# Patient Record
Sex: Male | Born: 1965 | Race: White | Hispanic: No | Marital: Married | Smoking: Current every day smoker
Health system: Southern US, Community
[De-identification: ages and names within clinical notes are randomized; demographics above are authoritative.]

## PROBLEM LIST (undated history)

## (undated) HISTORY — PX: CERVICAL DISC SURGERY: SHX588

---

## 2014-10-14 ENCOUNTER — Encounter (HOSPITAL_COMMUNITY): Payer: Self-pay | Admitting: Emergency Medicine

## 2014-10-14 ENCOUNTER — Emergency Department (HOSPITAL_COMMUNITY)
Admission: EM | Admit: 2014-10-14 | Discharge: 2014-10-14 | Disposition: A | Discharge status: Home / Self Care | Payer: Self-pay | Attending: Emergency Medicine | Admitting: Emergency Medicine

## 2014-10-14 DIAGNOSIS — L255 Unspecified contact dermatitis due to plants, except food: Secondary | ICD-10-CM

## 2014-10-14 DIAGNOSIS — Z720 Tobacco use: Secondary | ICD-10-CM | POA: Insufficient documentation

## 2014-10-14 DIAGNOSIS — L237 Allergic contact dermatitis due to plants, except food: Secondary | ICD-10-CM | POA: Insufficient documentation

## 2014-10-14 MED ORDER — FAMOTIDINE IN NACL 20-0.9 MG/50ML-% IV SOLN
20.0000 mg | Freq: Once | INTRAVENOUS | Status: AC
  Administered 2014-10-14: 20 mg via INTRAVENOUS
  Filled 2014-10-14: qty 50

## 2014-10-14 MED ORDER — PREDNISONE 20 MG PO TABS: 40.0000 mg | ORAL_TABLET | Freq: Every day | ORAL | Status: DC

## 2014-10-14 MED ORDER — SODIUM CHLORIDE 0.9 % IV BOLUS (SEPSIS)
500.0000 mL | Freq: Once | INTRAVENOUS | Status: AC
  Administered 2014-10-14: 500 mL via INTRAVENOUS

## 2014-10-14 MED ORDER — METHYLPREDNISOLONE SODIUM SUCC 125 MG IJ SOLR
125.0000 mg | Freq: Once | INTRAMUSCULAR | Status: AC
  Administered 2014-10-14: 125 mg via INTRAVENOUS
  Filled 2014-10-14: qty 2

## 2014-10-14 MED ORDER — DIPHENHYDRAMINE HCL 50 MG/ML IJ SOLN
25.0000 mg | Freq: Once | INTRAMUSCULAR | Status: AC
  Administered 2014-10-14: 25 mg via INTRAVENOUS
  Filled 2014-10-14: qty 1

## 2014-10-14 NOTE — ED Notes (Signed)
Pt reports he was working outside yesterday in poison [place]. Woke up during the night with facial edema and redness to his arms. Pt denies any difficulty breathing.

## 2014-10-14 NOTE — ED Notes (Signed)
Swelling to right eye has decreased, pt states that he feels better and feels that the swelling has decreased as well, Tammy PA at bedside,

## 2014-10-14 NOTE — ED Provider Notes (Signed)
CSN: [id number]     Arrival date & time 10/14/14  1029 History  This chart was scribed for Jason Gilbertammi Toben Acuna, PA-C with Donnetta HutchingBrian Cook, MD by Tonye RoyaltyJoshua Chen, ED Scribe. This patient was seen in room APA10/APA10 and the patient's care was started at 11:11 AM.    Chief Complaint  Patient presents with  . Allergic Reaction   The history is provided by the patient. No language interpreter was used.    HPI Comments: Jason GooRobert W Ho is a 49 y.o. male who presents to the Emergency Department complaining of swelling and redness to face and arms upon waking today. He also reports itchiness but denies any pain. He states he was doing yard work outside yesterday and came into contact with poison [place]ac, though he was wearing long sleeves. He denies anything affecting to his chest, back, or legs. He has not used Benadryl, cold compress, or anything else for his symptoms. He states he has had poison [place]ac before but states it has not been this bad previously. He does not use HTN medication, ASA, or Ibuprofen. He denies new lotions, soaps, or detergents. He denies difficulty swallowing, fever or SOB.  History reviewed. No pertinent past medical history. Past Surgical History  Procedure Laterality Date  . Cervical disc surgery     History reviewed. No pertinent family history. History  Substance Use Topics  . Smoking status: Current Every Day Smoker -- 0.50 packs/day    Types: Cigarettes  . Smokeless tobacco: Never Used  . Alcohol Use: No    Review of Systems  Constitutional: Negative for fever, activity change and appetite change.  HENT: Positive for facial swelling. Negative for mouth sores, trouble swallowing and voice change.   Respiratory: Negative for chest tightness, shortness of breath and wheezing.   Gastrointestinal: Negative for nausea and vomiting.  Musculoskeletal: Negative for neck pain.  Skin: Positive for color change and rash.  Neurological: Negative for dizziness, weakness, numbness and  headaches.  All other systems reviewed and are negative.     Allergies  Poison sumac extract  Home Medications   Prior to Admission medications   Not on File   BP 167/96 mmHg  Pulse 84  Temp(Src) 97.5 F (36.4 C) (Oral)  Ht 5\' 4"  (1.626 m)  Wt 130 lb (58.968 kg)  BMI 22.30 kg/m2  SpO2 99% Physical Exam  Constitutional: He is oriented to person, place, and time. He appears well-developed and well-nourished.  HENT:  Head: Normocephalic and atraumatic.  Right Ear: Tympanic membrane and ear canal normal.  Left Ear: Tympanic membrane and ear canal normal.  Mouth/Throat: Uvula is midline, oropharynx is clear and moist and mucous membranes are normal. No trismus in the jaw. No uvula swelling. No oropharyngeal exudate.  Diffuse facial edema including bilateral upper eyelids  Eyes: Conjunctivae and EOM are normal.  Neck: Normal range of motion. Neck supple.  Cardiovascular: Normal rate, regular rhythm and normal heart sounds.   No murmur heard. Pulmonary/Chest: Effort normal and breath sounds normal. No respiratory distress. He has no wheezes. He has no rales.  Airway clear  Musculoskeletal: Normal range of motion. He exhibits no edema or tenderness.  Neurological: He is alert and oriented to person, place, and time. He exhibits normal muscle tone. Coordination normal.  Skin: Skin is warm and dry.  erythematous maculopapular rash to the face, neck, and bilateral forearms  Psychiatric: He has a normal mood and affect.  Nursing note and vitals reviewed.   ED Course  Procedures (including critical  care time)  DIAGNOSTIC STUDIES: Oxygen Saturation is 99% on room air, normal by my interpretation.    COORDINATION OF CARE: 11:19 AM Discussed treatment plan with patient at beside, including cold compress, Benadryl, and steroid. The patient agrees with the plan and has no further questions at this time.   Labs Review Labs Reviewed - No data to display  Imaging Review No  results found.   EKG Interpretation None      MDM   Final diagnoses:  Plant dermatitis    On recheck, pt is feeling better, facial swelling improved.  Airway remains patent.  Patient is feeling better and ready for d/c. Agrees to cool compresses, benadryl and prednisone.  Advised to close f/u with PMD or to return here if needed   I personally performed the services described in this documentation, which was scribed in my presence. The recorded information has been reviewed and is accurate.   Jason Gilbert, PA-C 10/16/14 2005  Donnetta Hutching, MD 10/18/14 1538

## 2014-10-14 NOTE — ED Notes (Signed)
Pt c/o allergic reaction, facial swelling, pt states that he was out working in yard yesterday, started having facial swelling worse around the eyes during the night, rash noted to inside of arm, top of forehead, behind neck and ears area. Pt denies any problems swallowing, sob, pain, states that he was able to drink coffee this am fine, pt sitting semi fowlers on stretcher, no distress noted,

## 2014-10-14 NOTE — Discharge Instructions (Signed)
Poison Ivy °Poison ivy is a rash caused by touching the leaves of the poison ivy plant. The rash often shows up 48 hours later. You might just have bumps, redness, and itching. Sometimes, blisters appear and break open. Your eyes may get puffy (swollen). Poison ivy often heals in 2 to 3 weeks without treatment. °HOME CARE °· If you touch poison ivy: °¨ Wash your skin with soap and water right away. Wash under your fingernails. Do not rub the skin very hard. °¨ Wash any clothes you were wearing. °· Avoid poison ivy in the future. Poison ivy has 3 leaves on a stem. °· Use medicine to help with itching as told by your doctor. Do not drive when you take this medicine. °· Keep open sores dry, clean, and covered with a bandage and medicated cream, if needed. °· Ask your doctor about medicine for children. °GET HELP RIGHT AWAY IF: °· You have open sores. °· Redness spreads beyond the area of the rash. °· There is yellowish white fluid (pus) coming from the rash. °· Pain gets worse. °· You have a temperature by mouth above 102° F (38.9° C), not controlled by medicine. °MAKE SURE YOU: °· Understand these instructions. °· Will watch your condition. °· Will get help right away if you are not doing well or get worse. °Document Released: 08/22/2010 Document Revised: 10/12/2011 Document Reviewed: 08/22/2010 °Exit[employer]® Patient Information ©2015 [employer]. This information is not intended to replace advice given to you by your health care provider. Make sure you discuss any questions you have with your health care provider. ° °Poison Oak °Poison oak is a rash caused by touching the leaves of the poison oak plant. You may have a rash with redness and itching. Sometimes, blisters appear and break open. Your eyes may get puffy (swollen). Poison oak often heals in 2 to 3 weeks without treatment.  °HOME CARE °· If you touch poison oak: °¨ Wash your skin with soap and water right away. Wash under your fingernails. Do not rub the skin  very hard. °¨ Wash any clothes you were wearing. °· Avoid poison oak in the future. Poison oak usually has 3 leaves on a stem. °· Use medicines to help with itching as told by your doctor. Do not drive when you take this medicine. °· Keep open sores dry, clean, and covered with a bandage and medicated cream, if needed. °· Ask your doctor about medicine for children. °GET HELP RIGHT AWAY IF: °· You have open sores. °· Redness spreads beyond the area of the rash. °· There is yellowish white fluid (pus) coming from the rash. °· Pain gets worse. °· You have a temperature by mouth above 102° F (38.9° C), not controlled by medicine. °MAKE SURE YOU: °· Understand these instructions. °· Will watch your condition. °· Will get help right away if you are not doing well or get worse. °Document Released: 08/22/2010 Document Revised: 10/12/2011 Document Reviewed: 08/22/2010 °ExitCare® Patient Information ©2015 [employer]. This information is not intended to replace advice given to you by your health care provider. Make sure you discuss any questions you have with your health care provider. ° °

## 2014-10-14 NOTE — ED Notes (Signed)
Tammy PA at bedside,  

## 2015-03-29 ENCOUNTER — Encounter (HOSPITAL_COMMUNITY): Payer: Self-pay | Admitting: Emergency Medicine

## 2015-03-29 DIAGNOSIS — F17210 Nicotine dependence, cigarettes, uncomplicated: Secondary | ICD-10-CM | POA: Diagnosis present

## 2015-03-29 DIAGNOSIS — H6022 Malignant otitis externa, left ear: Principal | ICD-10-CM | POA: Diagnosis present

## 2015-03-29 DIAGNOSIS — R030 Elevated blood-pressure reading, without diagnosis of hypertension: Secondary | ICD-10-CM | POA: Diagnosis present

## 2015-03-29 DIAGNOSIS — L03211 Cellulitis of face: Secondary | ICD-10-CM | POA: Diagnosis present

## 2015-03-29 NOTE — ED Notes (Signed)
Patient complaining of left earache x 2 weeks.

## 2015-03-30 ENCOUNTER — Emergency Department (HOSPITAL_COMMUNITY): Payer: Self-pay

## 2015-03-30 ENCOUNTER — Inpatient Hospital Stay (HOSPITAL_COMMUNITY)
Admission: EM | Admit: 2015-03-30 | Discharge: 2015-04-01 | DRG: 155 | Disposition: A | Discharge status: Home / Self Care | Payer: Self-pay | Attending: Internal Medicine | Admitting: Internal Medicine

## 2015-03-30 ENCOUNTER — Encounter (HOSPITAL_COMMUNITY): Payer: Self-pay | Admitting: *Deleted

## 2015-03-30 DIAGNOSIS — L03211 Cellulitis of face: Secondary | ICD-10-CM

## 2015-03-30 DIAGNOSIS — L0390 Cellulitis, unspecified: Secondary | ICD-10-CM | POA: Diagnosis present

## 2015-03-30 DIAGNOSIS — R030 Elevated blood-pressure reading, without diagnosis of hypertension: Secondary | ICD-10-CM

## 2015-03-30 DIAGNOSIS — H60502 Unspecified acute noninfective otitis externa, left ear: Secondary | ICD-10-CM | POA: Insufficient documentation

## 2015-03-30 DIAGNOSIS — H6022 Malignant otitis externa, left ear: Secondary | ICD-10-CM

## 2015-03-30 DIAGNOSIS — L0201 Cutaneous abscess of face: Secondary | ICD-10-CM

## 2015-03-30 LAB — CBC WITH DIFFERENTIAL/PLATELET
BASOS ABS: 0 10*3/uL (ref 0.0–0.1)
BASOS PCT: 0 % (ref 0–1)
EOS PCT: 3 % (ref 0–5)
Eosinophils Absolute: 0.3 10*3/uL (ref 0.0–0.7)
HCT: 43.6 % (ref 39.0–52.0)
Hemoglobin: 15.4 g/dL (ref 13.0–17.0)
Lymphocytes Relative: 40 % (ref 12–46)
Lymphs Abs: 4.8 10*3/uL — ABNORMAL HIGH (ref 0.7–4.0)
MCH: 33.6 pg (ref 26.0–34.0)
MCHC: 35.3 g/dL (ref 30.0–36.0)
MCV: 95 fL (ref 78.0–100.0)
Monocytes Absolute: 0.7 10*3/uL (ref 0.1–1.0)
Monocytes Relative: 6 % (ref 3–12)
NEUTROS PCT: 51 % (ref 43–77)
Neutro Abs: 6 10*3/uL (ref 1.7–7.7)
PLATELETS: 302 10*3/uL (ref 150–400)
RBC: 4.59 MIL/uL (ref 4.22–5.81)
RDW: 12.8 % (ref 11.5–15.5)
WBC: 11.9 10*3/uL — ABNORMAL HIGH (ref 4.0–10.5)

## 2015-03-30 LAB — COMPREHENSIVE METABOLIC PANEL
ALT: 18 U/L (ref 17–63)
AST: 17 U/L (ref 15–41)
Albumin: 4 g/dL (ref 3.5–5.0)
Alkaline Phosphatase: 95 U/L (ref 38–126)
Anion gap: 8 (ref 5–15)
BILIRUBIN TOTAL: 0.4 mg/dL (ref 0.3–1.2)
BUN: 16 mg/dL (ref 6–20)
CO2: 28 mmol/L (ref 22–32)
Calcium: 8.8 mg/dL — ABNORMAL LOW (ref 8.9–10.3)
Chloride: 102 mmol/L (ref 101–111)
Creatinine, Ser: 0.87 mg/dL (ref 0.61–1.24)
GFR calc Af Amer: >60 mL/min (ref >60)
GFR calc non Af Amer: >60 mL/min (ref >60)
Glucose, Bld: 110 mg/dL — ABNORMAL HIGH (ref 65–99)
Potassium: 3.6 mmol/L (ref 3.5–5.1)
Sodium: 138 mmol/L (ref 135–145)
TOTAL PROTEIN: 7.7 g/dL (ref 6.5–8.1)

## 2015-03-30 MED ORDER — VANCOMYCIN HCL IN DEXTROSE 1-5 GM/200ML-% IV SOLN
1000.0000 mg | Freq: Two times a day (BID) | INTRAVENOUS | Status: DC
  Administered 2015-03-30 – 2015-04-01 (×4): 1000 mg via INTRAVENOUS
  Filled 2015-03-30 (×6): qty 200

## 2015-03-30 MED ORDER — IOHEXOL 300 MG/ML  SOLN
100.0000 mL | Freq: Once | INTRAMUSCULAR | Status: AC | PRN
  Administered 2015-03-30: 100 mL via INTRAVENOUS

## 2015-03-30 MED ORDER — MORPHINE SULFATE (PF) 2 MG/ML IV SOLN: 2.0000 mg | INTRAVENOUS | Status: DC | PRN

## 2015-03-30 MED ORDER — ENOXAPARIN SODIUM 40 MG/0.4ML ~~LOC~~ SOLN
40.0000 mg | SUBCUTANEOUS | Status: DC
  Administered 2015-03-30: 40 mg via SUBCUTANEOUS
  Filled 2015-03-30 (×3): qty 0.4

## 2015-03-30 MED ORDER — PIPERACILLIN-TAZOBACTAM 3.375 G IVPB 30 MIN
3.3750 g | Freq: Once | INTRAVENOUS | Status: AC
  Administered 2015-03-30: 3.375 g via INTRAVENOUS
  Filled 2015-03-30: qty 50

## 2015-03-30 MED ORDER — VANCOMYCIN HCL IN DEXTROSE 1-5 GM/200ML-% IV SOLN
1000.0000 mg | Freq: Once | INTRAVENOUS | Status: AC
  Administered 2015-03-30: 1000 mg via INTRAVENOUS
  Filled 2015-03-30: qty 200

## 2015-03-30 MED ORDER — SODIUM CHLORIDE 0.9 % IV SOLN
INTRAVENOUS | Status: DC
  Administered 2015-03-30 (×2): via INTRAVENOUS

## 2015-03-30 MED ORDER — PIPERACILLIN-TAZOBACTAM 3.375 G IVPB
3.3750 g | Freq: Three times a day (TID) | INTRAVENOUS | Status: DC
  Administered 2015-03-30 – 2015-04-01 (×6): 3.375 g via INTRAVENOUS
  Filled 2015-03-30 (×10): qty 50

## 2015-03-30 NOTE — ED Provider Notes (Signed)
CSN: [id number]     Arrival date & time 03/29/15  2114 History  This chart was scribed for Jason Albe, MD by Placido Sou, ED scribe. This patient was seen in room APA05/APA05 and the patient's care was started at 12:58 AM.  Chief Complaint  Patient presents with  . Otalgia   The history is provided by the patient. No language interpreter was used.    HPI Comments: Jason Ho is a 49 y.o. male who presents to the Emergency Department complaining of worsening, moderate, left ear pain with onset 2 week ago. He notes that since onset he has noticed worsening symptoms including an associated, constant, moderate, left sided headache, left sided facial swelling and swelling of his left ear that started yesterday, chills and drainage from the affected ear. No known fever. Pt notes a shx including a left eardrum removal. He notes currently smoking 1/2 PPD. He denies a hx of DM. He denies any fevers.    PCP none No local ENT  History reviewed. No pertinent past medical history. Past Surgical History  Procedure Laterality Date  . Cervical disc surgery     History reviewed. No pertinent family history. Social History  Substance Use Topics  . Smoking status: Current Every Day Smoker -- 0.50 packs/day    Types: Cigarettes  . Smokeless tobacco: Never Used  . Alcohol Use: No  lives with spouse  Review of Systems  Constitutional: Positive for chills. Negative for fever.  HENT: Positive for ear discharge, ear pain and facial swelling.   Neurological: Positive for headaches.  All other systems reviewed and are negative.  Allergies  Poison sumac extract  Home Medications   Prior to Admission medications   Medication Sig Start Date End Date Taking? Authorizing Provider  predniSONE (DELTASONE) 20 MG tablet Take 2 tablets (40 mg total) by mouth daily. For 5 days 10/14/14   Tammy Triplett, PA-C   BP 177/94 mmHg  Pulse 100  Temp(Src) 97.6 F (36.4 C) (Oral)  Resp 23  Ht  (1.651  m)  Wt 130 lb (58.968 kg)  BMI 21.63 kg/m2  SpO2 100%  Vital signs normal   Physical Exam  Constitutional: He is oriented to person, place, and time. He appears well-developed and well-nourished.  Non-toxic appearance. He does not appear ill. No distress.  HENT:  Head: Normocephalic and atraumatic.  Right Ear: External ear normal.  Left Ear: External ear normal.  Nose: Nose normal. No mucosal edema or rhinorrhea.  Mouth/Throat: Oropharynx is clear and moist and mucous membranes are normal. No dental abscesses or uvula swelling.  Left ear canal is swollen shut with edema and  Redness of the ear canal; mild diffuse redness of left helix and the helix sticks out from his head more than the right. He has pus coming out of the ear canal on the left. He has mild diffuse swelling of his left face in the parotid area without localization of pain.   Eyes: Conjunctivae and EOM are normal. Pupils are equal, round, and reactive to light.  Neck: Normal range of motion and full passive range of motion without pain. Neck supple.  Cardiovascular: Normal rate, regular rhythm and normal heart sounds.  Exam reveals no gallop and no friction rub.   No murmur heard. Pulmonary/Chest: Effort normal and breath sounds normal. No respiratory distress. He has no wheezes. He has no rhonchi. He has no rales. He exhibits no tenderness and no crepitus.  Abdominal: Soft. Normal appearance and bowel sounds  are normal. He exhibits no distension. There is no tenderness. There is no rebound and no guarding.  Musculoskeletal: Normal range of motion. He exhibits no edema or tenderness.  Moves all extremities well.   Neurological: He is alert and oriented to person, place, and time. He has normal strength. No cranial nerve deficit.  Skin: Skin is warm, dry and intact. No rash noted. No erythema. No pallor.  Psychiatric: He has a normal mood and affect. His speech is normal and behavior is normal. His mood appears not anxious.    Nursing note and vitals reviewed.      ED Course  Procedures   Medications  0.9 %  sodium chloride infusion ( Intravenous New Bag/Given 03/30/15 0134)  vancomycin (VANCOCIN) IVPB 1000 mg/200 mL premix (1,000 mg Intravenous New Bag/Given 03/30/15 0418)  iohexol (OMNIPAQUE) 300 MG/ML solution 100 mL (100 mLs Intravenous Contrast Given 03/30/15 0151)  piperacillin-tazobactam (ZOSYN) IVPB 3.375 g (0 g Intravenous Stopped 03/30/15 0417)    DIAGNOSTIC STUDIES: Oxygen Saturation is 100% on RA, normal by my interpretation.    COORDINATION OF CARE: 1:03 AM Discussed treatment plan with pt at bedside and pt agreed to plan. CT maxillofacial done to assess for cellulitis/abscess formation in the face or ear canal.  03:53 Dr Sharl Ma, please talk to ENT  04:00 Dr Jearld Fenton, ENT given his CT results and PE, feels patient could stay here and get IV antibiotics and see how he responds, not a surgical intervention at this time. If he doesn't respond to IV antibiotics or seems to get worse, can be transferred at that time for ENT evaluation.   04:30 Dr Sharl Ma, admit to [facility] Review Results for orders placed or performed during the hospital encounter of 03/30/15  Comprehensive metabolic panel  Result Value Ref Range   Sodium 138 135 - 145 mmol/L   Potassium 3.6 3.5 - 5.1 mmol/L   Chloride 102 101 - 111 mmol/L   CO2 28 22 - 32 mmol/L   Glucose, Bld 110 (H) 65 - 99 mg/dL   BUN 16 6 - 20 mg/dL   Creatinine, Ser 1.61 0.61 - 1.24 mg/dL   Calcium 8.8 (L) 8.9 - 10.3 mg/dL   Total Protein 7.7 6.5 - 8.1 g/dL   Albumin 4.0 3.5 - 5.0 g/dL   AST 17 15 - 41 U/L   ALT 18 17 - 63 U/L   Alkaline Phosphatase 95 38 - 126 U/L   Total Bilirubin 0.4 0.3 - 1.2 mg/dL   GFR calc non Af Amer >60 >60 mL/min   GFR calc Af Amer >60 >60 mL/min   Anion gap 8 5 - 15  CBC with Differential  Result Value Ref Range   WBC 11.9 (H) 4.0 - 10.5 K/uL   RBC 4.59 4.22 - 5.81 MIL/uL   Hemoglobin 15.4 13.0 - 17.0 g/dL   HCT  09.6 04.5 - 40.9 %   MCV 95.0 78.0 - 100.0 fL   MCH 33.6 26.0 - 34.0 pg   MCHC 35.3 30.0 - 36.0 g/dL   RDW 81.1 91.4 - 78.2 %   Platelets 302 150 - 400 K/uL   Neutrophils Relative % 51 43 - 77 %   Neutro Abs 6.0 1.7 - 7.7 K/uL   Lymphocytes Relative 40 12 - 46 %   Lymphs Abs 4.8 (H) 0.7 - 4.0 K/uL   Monocytes Relative 6 3 - 12 %   Monocytes Absolute 0.7 0.1 - 1.0 K/uL   Eosinophils Relative 3 0 -  5 %   Eosinophils Absolute 0.3 0.0 - 0.7 K/uL   Basophils Relative 0 0 - 1 %   Basophils Absolute 0.0 0.0 - 0.1 K/uL    Laboratory interpretation all normal except leukocytosis    Imaging Review Ct Maxillofacial W/cm  03/30/2015   CLINICAL DATA:  Initial evaluation for pain and swelling at left ear canal.  EXAM: CT MAXILLOFACIAL WITH CONTRAST  TECHNIQUE: Multidetector CT imaging of the maxillofacial structures was performed with intravenous contrast. Multiplanar CT image reconstructions were also generated. A small metallic BB was placed on the right temple in order to reliably differentiate right from left.  CONTRAST:  OMNIPAQUE IOHEXOL 300 MG/ML  SOLN  COMPARISON:  None.  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 Predominantly fat density present within the mastoid bowl and left external auditory canal, which are completely opacified. Left middle ear cavity is opacified. The left-sided ossicles are absent. The left-sided ossicles are not visualized, likely removed. No osseous erosion. There is overlying soft tissue swelling about the left external auditory canal and left ear, extending anteriorly towards the left temporal region. Mild hazy fat stranding within this region. Findings suspicious for cellulitis. There is a superimposed somewhat ill-defined hypodense collection measuring approximately 9 x 9 x 4 mm just superior to the left EAC, suspicious for possible small superimposed abscess. No other loculated fluid collection.  Visualized  intracranial contents demonstrate no acute abnormality. Sigmoid sinus and jugular vein well opacified.  No adenopathy within the visualized neck.  Mild scattered opacity within the anterior ethmoidal air cells. There is mucosal thickening within the frontal sinuses/frontal ethmoidal recesses. Polypoid opacity within the maxillary sinuses bilaterally.  IMPRESSION: 1. Postoperative changes from prior canal wall down mastoidectomy. Mild overlying soft tissue swelling and inflammatory stranding about the left the EAC/ear and left temporal region suspicious for cellulitis. No intracranial extension. 2. Thin 9 x 9 x 4 mm hypodense hypodense collection at the superior margin of the left EAC, just medial to the left auricle, suspicious for small superimposed abscess.   Electronically Signed   By: Rise Mu M.D.   On: 03/30/2015 02:47   I have personally reviewed and evaluated these images and lab results as part of my medical decision-making.   EKG Interpretation None      MDM   Final diagnoses:  Malignant otitis externa of left ear  Facial cellulitis    Plan admission  Jason Albe, MD, FACEP   I personally performed the services described in this documentation, which was scribed in my presence. The recorded information has been reviewed and considered.  Jason Albe, MD, Concha Pyo, MD 03/30/15 [phone]

## 2015-03-30 NOTE — Progress Notes (Signed)
Patient seen and examined. Admitted earlier today for left ear infection with possible abscess, spreading to facial cellulitis. Will continue vanc and zosyn for now. When ready for discharge will send home with a prescription for PO abx and will need ENT follow up. EDP had discussed this plan with ENT on call, Dr. Jearld Fenton. Will continue to follow.  Peggye Pitt, MD [facility] Pager: [phone]

## 2015-03-30 NOTE — Progress Notes (Signed)
ANTIBIOTIC CONSULT NOTE - INITIAL  Pharmacy Consult for vancomycin and zosyn Indication: cellulitis  Allergies  Allergen Reactions  . Poison [place] Extract Swelling    Patient Measurements: Height:  (165.1 cm) Weight: 130 lb 14.4 oz (59.376 kg) IBW/kg (Calculated) : 61.5   Vital Signs: Temp: 97.6 F (36.4 C) (08/27 0525) Temp Source: Oral (08/27 0525) BP: 171/86 mmHg (08/27 0525) Pulse Rate: 76 (08/27 0525) Intake/Output from previous day:   Intake/Output from this shift:    Labs:  Recent Labs  03/30/15 0130  WBC 11.9*  HGB 15.4  PLT 302  CREATININE 0.87   Estimated Creatinine Clearance: 85.3 mL/min (by C-G formula based on Cr of 0.87). No results for input(s): VANCOTROUGH, VANCOPEAK, VANCORANDOM, GENTTROUGH, GENTPEAK, GENTRANDOM, TOBRATROUGH, TOBRAPEAK, TOBRARND, AMIKACINPEAK, AMIKACINTROU, AMIKACIN in the last 72 hours.   Microbiology: No results found for this or any previous visit (from the past 720 hour(s)).  Medical History: History reviewed. No pertinent past medical history.  Medications:  No prescriptions prior to admission   Assessment: 49 yo man to start empiric antibiotics for cellulitis.    Goal of Therapy:  Vancomycin trough level 10-15 mcg/ml  Plan:  Zosyn 3.375 gm IV q8 hours IE Vancomycin 1 gm IV q12 hours F/u renal function, cultures and clinical course  Thanks for allowing pharmacy to be a part of this patient's care.  Talbert Cage, PharmD Clinical Pharmacist 03/30/2015,8:01 AM

## 2015-03-30 NOTE — H&P (Signed)
PCP:   No PCP Per Patient   Chief Complaint:  Left ear pain  HPI:  24 old male with no significant medical history , today came to the ED with worsening left ear pain which started about 2 weeks ago. Patient says that he has had surgery in the left ear where the tympanic membrane was removed by the ENT surgeon many years ago. 2 weeks ago patient started having pain in the left ear, and he put hydrogen peroxide in the ear  to cure the infection, but since then the ear started becoming more painful and swollen. He denies fever, no chills. No nausea vomiting or diarrhea He denies chest pain, no shortness of breath. In the ED maxillofacial CT showed soft tissue swelling and inflammatory stranding about the left ear and left temporal region suspicious for cellulitis. 9 x 9 x 4 mm hypodense collection at the superior margin of the left external auditory canal suspicious for small superimposed abscess.  Allergies:   Allergies  Allergen Reactions  . Poison Sumac Extract Swelling     History reviewed. No pertinent past medical history.  Past Surgical History  Procedure Laterality Date  . Cervical disc surgery      Prior to Admission medications   Medication Sig Start Date End Date Taking? Authorizing Provider  predniSONE (DELTASONE) 20 MG tablet Take 2 tablets (40 mg total) by mouth daily. For 5 days 10/14/14   Pauline Aus, PA-C    Social History:  reports that he has been smoking Cigarettes.  He has been smoking about 0.50 packs per day. He has never used smokeless tobacco. He reports that he does not drink alcohol or use illicit drugs.    Filed Weights   03/29/15 2203  Weight: 58.968 kg (130 lb)    All the positives are listed in BOLD  Review of Systems:  HEENT: Headache, blurred vision, runny nose, sore throat Neck: Hypothyroidism, hyperthyroidism,,lymphadenopathy Chest : Shortness of breath, history of COPD, Asthma Heart : Chest pain, history of coronary arterey  disease GI:  Nausea, vomiting, diarrhea, constipation, GERD GU: Dysuria, urgency, frequency of urination, hematuria Neuro: Stroke, seizures, syncope Psych: Depression, anxiety, hallucinations   Physical Exam: Blood pressure 142/88, pulse 70, temperature 97.6 F (36.4 C), temperature source Oral, resp. rate 14, height  (1.651 m), weight 58.968 kg (130 lb), SpO2 97 %. Constitutional:   Patient is a well-developed and well-nourished male* in no acute distress and cooperative with exam. Head: Normocephalic and atraumatic Mouth: Mucus membranes moist Eyes: PERRL, EOMI, conjunctivae normal Ears: Left ear is edematous,  Erythematous, visible pus noted in the left external auditory canal Neck: Supple, No Thyromegaly Cardiovascular: RRR, S1 normal, S2 normal Pulmonary/Chest: CTAB, no wheezes, rales, or rhonchi Abdominal: Soft. Non-tender, non-distended, bowel sounds are normal, no masses, organomegaly, or guarding present.  Neurological: A&O x3, Strength is normal and symmetric bilaterally, cranial nerve II-XII are grossly intact, no focal motor deficit, sensory intact to light touch bilaterally.  Extremities : No Cyanosis, Clubbing or Edema  Labs on Admission:  Basic Metabolic Panel:  Recent Labs Lab 03/30/15 0130  NA 138  K 3.6  CL 102  CO2 28  GLUCOSE 110*  BUN 16  CREATININE 0.87  CALCIUM 8.8*   Liver Function Tests:  Recent Labs Lab 03/30/15 0130  AST 17  ALT 18  ALKPHOS 95  BILITOT 0.4  PROT 7.7  ALBUMIN 4.0   No results for input(s): LIPASE, AMYLASE in the last 168 hours. No results for  input(s): AMMONIA in the last 168 hours. CBC:  Recent Labs Lab 03/30/15 0130  WBC 11.9*  NEUTROABS 6.0  HGB 15.4  HCT 43.6  MCV 95.0  PLT 302   Radiological Exams on Admission: Ct Maxillofacial W/cm  03/30/2015   CLINICAL DATA:  Initial evaluation for pain and swelling at left ear canal.  EXAM: CT MAXILLOFACIAL WITH CONTRAST  TECHNIQUE: Multidetector CT imaging of  the maxillofacial structures was performed with intravenous contrast. Multiplanar CT image reconstructions were also generated. A small metallic BB was placed on the right temple in order to reliably differentiate right from left.  CONTRAST:  OMNIPAQUE IOHEXOL 300 MG/ML  SOLN  COMPARISON:  None.  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 Predominantly fat density present within the mastoid bowl and left external auditory canal, which are completely opacified. Left middle ear cavity is opacified. The left-sided ossicles are absent. The left-sided ossicles are not visualized, likely removed. No osseous erosion. There is overlying soft tissue swelling about the left external auditory canal and left ear, extending anteriorly towards the left temporal region. Mild hazy fat stranding within this region. Findings suspicious for cellulitis. There is a superimposed somewhat ill-defined hypodense collection measuring approximately 9 x 9 x 4 mm just superior to the left EAC, suspicious for possible small superimposed abscess. No other loculated fluid collection.  Visualized intracranial contents demonstrate no acute abnormality. Sigmoid sinus and jugular vein well opacified.  No adenopathy within the visualized neck.  Mild scattered opacity within the anterior ethmoidal air cells. There is mucosal thickening within the frontal sinuses/frontal ethmoidal recesses. Polypoid opacity within the maxillary sinuses bilaterally.  IMPRESSION: 1. Postoperative changes from prior canal wall down mastoidectomy. Mild overlying soft tissue swelling and inflammatory stranding about the left the EAC/ear and left temporal region suspicious for cellulitis. No intracranial extension. 2. Thin 9 x 9 x 4 mm hypodense hypodense collection at the superior margin of the left EAC, just medial to the left auricle, suspicious for small superimposed abscess.   Electronically Signed   By: Rise Mu M.D.   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 Dr. Lynelle Doctor called the ENT surgeon Dr. Jearld Fenton, who recommended no surgery at this time and to continue IV antibiotics. If no improvement consider transfer to [facility].    Code status: Full code  Family discussion: Admission, patients condition and plan of care including tests being ordered have been discussed with the patient and *his wife at bedside who indicate understanding and agree with the plan and Code Status.   Time Spent on Admission: 55 min  Quy Lotts S [facility] Pager: [phone] 03/30/2015, 4:59 AM  If 7PM-7AM, please contact night-coverage  [email or web address]  Password [email or web address]

## 2015-03-31 DIAGNOSIS — H6092 Unspecified otitis externa, left ear: Secondary | ICD-10-CM

## 2015-03-31 DIAGNOSIS — R030 Elevated blood-pressure reading, without diagnosis of hypertension: Secondary | ICD-10-CM

## 2015-03-31 LAB — COMPREHENSIVE METABOLIC PANEL
ALK PHOS: 83 U/L (ref 38–126)
ALT: 16 U/L — ABNORMAL LOW (ref 17–63)
ANION GAP: 7 (ref 5–15)
AST: 13 U/L — ABNORMAL LOW (ref 15–41)
Albumin: 3.4 g/dL — ABNORMAL LOW (ref 3.5–5.0)
BILIRUBIN TOTAL: 0.7 mg/dL (ref 0.3–1.2)
BUN: 10 mg/dL (ref 6–20)
CALCIUM: 8.3 mg/dL — AB (ref 8.9–10.3)
CO2: 25 mmol/L (ref 22–32)
Chloride: 107 mmol/L (ref 101–111)
Creatinine, Ser: 0.8 mg/dL (ref 0.61–1.24)
GFR calc non Af Amer: >60 mL/min (ref >60)
Glucose, Bld: 109 mg/dL — ABNORMAL HIGH (ref 65–99)
Potassium: 3.8 mmol/L (ref 3.5–5.1)
SODIUM: 139 mmol/L (ref 135–145)
TOTAL PROTEIN: 6.7 g/dL (ref 6.5–8.1)

## 2015-03-31 LAB — CBC
HEMATOCRIT: 42.6 % (ref 39.0–52.0)
HEMOGLOBIN: 15.1 g/dL (ref 13.0–17.0)
MCH: 33.4 pg (ref 26.0–34.0)
MCHC: 35.4 g/dL (ref 30.0–36.0)
MCV: 94.2 fL (ref 78.0–100.0)
Platelets: 302 10*3/uL (ref 150–400)
RBC: 4.52 MIL/uL (ref 4.22–5.81)
RDW: 12.8 % (ref 11.5–15.5)
WBC: 9.5 10*3/uL (ref 4.0–10.5)

## 2015-03-31 NOTE — Progress Notes (Signed)
Utilization review Completed Ceilidh Torregrossa RN BSN   

## 2015-03-31 NOTE — Progress Notes (Signed)
ED/CM noted patient did not have health insurance and/or PCP listed in the computer.  Patient was given the [facility] resources handout with information on the clinics, food pantries, and the handout for new health insurance sign-up. Provided drug discount card. Patient expressed appreciation for information received.   Also provided Financial Coordinator phone number maybe able to assist with billing. Patient maybe possible candidate for Match assistance once discharged.

## 2015-03-31 NOTE — Progress Notes (Signed)
[facility] PROGRESS NOTE  Jason Ho ZOX:[id number] DOB: [birth year] DOA: 03/30/2015 PCP: No PCP Per Patient  Assessment/Plan: Left Acute Otitis Externa/Left facial Cellulitis -With possible abscess on CT. -Cellulitis is improved. -Continue vanc/zosyn for at least 1 more day. -Has purulent drainage out of left ear canal. -Will need a course of PO abx and follow up with ENT upon DC. (EDP discussed case with Dr. Jearld Fenton, who recommended admission for IV abx and no need for surgical intervention).  Elevated BP without diagnosis of HTN -Continue to follow for now. -Will need close OP follow up.  Code Status: Full Code Family Communication: Patient only  Disposition Plan: Home when ready, likely 24-48 hours.   Consultants:  None   Antibiotics:  Vanc  Zosyn   Subjective: Feels less pain, swelling to left face has improved. No CP, N/V.  Objective: Filed Vitals:   03/30/15 0525 03/30/15 1300 03/30/15 2123 03/31/15 0557  BP: 171/86 154/76 160/80 178/90  Pulse: 76 81 79 78  Temp: 97.6 F (36.4 C) 97.8 F (36.6 C) 98.5 F (36.9 C) 98.2 F (36.8 C)  TempSrc: Oral Oral Oral Oral  Resp: 16 16 16 16   Height: 5\' 5"  (1.651 m)     Weight: 59.376 kg (130 lb 14.4 oz)     SpO2: 97% 98% 97% 97%    Intake/Output Summary (Last 24 hours) at 03/31/15 0932 Last data filed at 03/30/15 1700  Gross per 24 hour  Intake   1040 ml  Output      0 ml  Net   1040 ml   Filed Weights   03/29/15 2203 03/30/15 0525  Weight: 58.968 kg (130 lb) 59.376 kg (130 lb 14.4 oz)    Exam:   General:  AA Ox3, left facial edema less prominent  Ears: Left: erythematous external canal, large amount of purulent drainage that obscures view of tympanic membrane. Right: ear wax, cannot visualize TM.  Cardiovascular: RRR, no M/R/G  Respiratory: CTA B  Abdomen: S/NT/ND/+BS  Extremities: no C/C/E/+ pulses   Neurologic:  Grossly intact and non-focal  Data Reviewed: Basic  Metabolic Panel:  Recent Labs Lab 03/30/15 0130 03/31/15 0550  NA 138 139  K 3.6 3.8  CL 102 107  CO2 28 25  GLUCOSE 110* 109*  BUN 16 10  CREATININE 0.87 0.80  CALCIUM 8.8* 8.3*   Liver Function Tests:  Recent Labs Lab 03/30/15 0130 03/31/15 0550  AST 17 13*  ALT 18 16*  ALKPHOS 95 83  BILITOT 0.4 0.7  PROT 7.7 6.7  ALBUMIN 4.0 3.4*   No results for input(s): LIPASE, AMYLASE in the last 168 hours. No results for input(s): AMMONIA in the last 168 hours. CBC:  Recent Labs Lab 03/30/15 0130 03/31/15 0550  WBC 11.9* 9.5  NEUTROABS 6.0  --   HGB 15.4 15.1  HCT 43.6 42.6  MCV 95.0 94.2  PLT 302 302   Cardiac Enzymes: No results for input(s): CKTOTAL, CKMB, CKMBINDEX, TROPONINI in the last 168 hours. BNP (last 3 results) No results for input(s): BNP in the last 8760 hours.  ProBNP (last 3 results) No results for input(s): PROBNP in the last 8760 hours.  CBG: No results for input(s): GLUCAP in the last 168 hours.  No results found for this or any previous visit (from the past 240 hour(s)).   Studies: Ct Maxillofacial W/cm  03/30/2015   CLINICAL DATA:  Initial evaluation for pain and swelling at left ear canal.  EXAM: CT MAXILLOFACIAL WITH  CONTRAST  TECHNIQUE: Multidetector CT imaging of the maxillofacial structures was performed with intravenous contrast. Multiplanar CT image reconstructions were also generated. A small metallic BB was placed on the right temple in order to reliably differentiate right from left.  CONTRAST:  OMNIPAQUE IOHEXOL 300 MG/ML  SOLN  COMPARISON:  None.  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 Predominantly fat density present within the mastoid bowl and left external auditory canal, which are completely opacified. Left middle ear cavity is opacified. The left-sided ossicles are absent. The left-sided ossicles are not visualized, likely removed. No osseous erosion. There is overlying  soft tissue swelling about the left external auditory canal and left ear, extending anteriorly towards the left temporal region. Mild hazy fat stranding within this region. Findings suspicious for cellulitis. There is a superimposed somewhat ill-defined hypodense collection measuring approximately 9 x 9 x 4 mm just superior to the left EAC, suspicious for possible small superimposed abscess. No other loculated fluid collection.  Visualized intracranial contents demonstrate no acute abnormality. Sigmoid sinus and jugular vein well opacified.  No adenopathy within the visualized neck.  Mild scattered opacity within the anterior ethmoidal air cells. There is mucosal thickening within the frontal sinuses/frontal ethmoidal recesses. Polypoid opacity within the maxillary sinuses bilaterally.  IMPRESSION: 1. Postoperative changes from prior canal wall down mastoidectomy. Mild overlying soft tissue swelling and inflammatory stranding about the left the EAC/ear and left temporal region suspicious for cellulitis. No intracranial extension. 2. Thin 9 x 9 x 4 mm hypodense hypodense collection at the superior margin of the left EAC, just medial to the left auricle, suspicious for small superimposed abscess.   Electronically Signed   By: Rise Mu M.D.   On: 03/30/2015 02:47    Scheduled Meds: . enoxaparin (LOVENOX) injection  40 mg Subcutaneous Q24H  . piperacillin-tazobactam (ZOSYN)  IV  3.375 g Intravenous Q8H  . vancomycin  1,000 mg Intravenous Q12H   Continuous Infusions: . sodium chloride 100 mL/hr at 03/30/15 1159    Principal Problem:   Acute otitis externa of left ear Active Problems:   Cellulitis, face   Cellulitis   Elevated blood pressure reading without diagnosis of hypertension    Time spent: 25 minutes. Greater than 50% of this time was spent in direct contact with the patient coordinating care.    Chaya Jan  [facility] Pager [phone]  If 7PM-7AM, please  contact night-coverage at [email or web address], password [email or web address] 03/31/2015, 9:32 AM  LOS: 1 day

## 2015-04-01 MED ORDER — CIPROFLOXACIN HCL 500 MG PO TABS: 500.0000 mg | ORAL_TABLET | Freq: Two times a day (BID) | ORAL | Status: AC

## 2015-04-01 NOTE — Care Management Note (Signed)
Case Management Note  Patient Details  Name: Jason Ho MRN: [id number] Date of Birth: June 25, 1966  Subjective/Objective:                  Pt admitted from home with ear infection and cellulitis of ear. Pt lives with his wife and will return home at discharge. Pt is independent with ADl's.   Action/Plan: Financial counselor is aware of self pay status. Pt follow up appt made with Dr. Suszanne Conners and documented on AVS. Pt also made aware of how much money to take to appt. AB on $4 list at [facility] and pt made aware. Pts nurse also aware of discharge arrangements.  Expected Discharge Date:  04/01/15               Expected Discharge Plan:  Home/Self Care  In-House Referral:  Financial Counselor  Discharge planning Services  CM Consult, [facility], Follow-up appt scheduled  Post Acute Care Choice:  NA Choice offered to:  NA  DME Arranged:    DME Agency:     HH Arranged:    HH Agency:     Status of Service:  Completed, signed off  Medicare Important Message Given:    Date Medicare IM Given:    Medicare IM give by:    Date Additional Medicare IM Given:    Additional Medicare Important Message give by:     If discussed at Long Length of Stay Meetings, dates discussed:    Additional Comments:  Cheryl Flash, RN 04/01/2015, 11:19 AM

## 2015-04-01 NOTE — Progress Notes (Signed)
Pt a/o.vss.saline lock removed. No complaints of any distress. Discharge instructions given. Prescriptions given. Pt verbalized understanding of instructions. Pt left floor via wheelchair and nursing staff.

## 2015-04-01 NOTE — Discharge Summary (Signed)
Physician Discharge Summary  Jason Ho:[id number] DOB: September 27, 1965 DOA: 03/30/2015  PCP: No PCP Per Patient  Admit date: 03/30/2015 Discharge date: 04/01/2015  Time spent: 45 minutes  Recommendations for Outpatient Follow-up:  -Will be discharged home today. -Advised to follow up with ENT in 3 days (CM will attempt to arrange appointment). -Will need follow up with primary care physician, especially to follow his elevated BP to see if he warrants antihypertensive medications.   Discharge Diagnoses:  Principal Problem:   Acute otitis externa of left ear Active Problems:   Cellulitis, face   Cellulitis   Elevated blood pressure reading without diagnosis of hypertension   Discharge Condition: Stable and improved  Filed Weights   03/29/15 2203 03/30/15 0525  Weight: 58.968 kg (130 lb) 59.376 kg (130 lb 14.4 oz)    History of present illness:  As per Dr. Sharl Ma 03/30/2015:  Patient says that he has had surgery in the left ear where the tympanic membrane was removed by the ENT surgeon many years ago. 2 weeks ago patient started having pain in the left ear, and he put hydrogen peroxide in the ear to cure the infection, but since then the ear started becoming more painful and swollen. He denies fever, no chills. No nausea vomiting or diarrhea He denies chest pain, no shortness of breath. In the ED maxillofacial CT showed soft tissue swelling and inflammatory stranding about the left ear and left temporal region suspicious for cellulitis. 9 x 9 x 4 mm hypodense collection at the superior margin of the left external auditory canal suspicious for small superimposed abscess.  Hospital Course:   Malignant Left Acute Otitis Externa/Left facial Cellulitis -With possible abscess on CT. On exam, his auditory canal is obscured by a large amount of purulent drainage that makes it impossible to visualize the TM. -Cellulitis is resolved. -Completed 3 days of vanc/zosyn. -Will  transition to cipro for 10 days as most cases caused by pseudomonas. Should also cover some gram positive organisms as well. -Will need a course of PO abx and follow up with ENT upon DC. (EDP discussed case with Dr. Jearld Fenton, who recommended admission for IV abx and no need for surgical intervention).  Elevated BP without diagnosis of HTN -Continue to follow for now. -Will need close OP follow up.   Procedures:  None   Consultations:  None  Discharge Instructions  Discharge Instructions    Diet - low sodium heart healthy    Complete by:  As directed      Increase activity slowly    Complete by:  As directed             Medication List    TAKE these medications        ciprofloxacin 500 MG tablet  Commonly known as:  CIPRO  Take 1 tablet (500 mg total) by mouth 2 (two) times daily.       Allergies  Allergen Reactions  . Poison Sumac Extract Swelling       Follow-up Information    Follow up with Suzanna Obey, MD. Schedule an appointment as soon as possible for a visit in 2 days.   Specialty:  Otolaryngology   Contact information:   [address] [place] [address] [phone]       Schedule an appointment as soon as possible for a visit in 2 weeks to follow up.   Why:  with your new primary care provider  The results of significant diagnostics from this hospitalization (including imaging, microbiology, ancillary and laboratory) are listed below for reference.    Significant Diagnostic Studies: Ct Maxillofacial W/cm  03/30/2015   CLINICAL DATA:  Initial evaluation for pain and swelling at left ear canal.  EXAM: CT MAXILLOFACIAL WITH CONTRAST  TECHNIQUE: Multidetector CT imaging of the maxillofacial structures was performed with intravenous contrast. Multiplanar CT image reconstructions were also generated. A small metallic BB was placed on the right temple in order to reliably differentiate right from left.  CONTRAST:  OMNIPAQUE IOHEXOL  300 MG/ML  SOLN  COMPARISON:  None.  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 Predominantly fat density present within the mastoid bowl and left external auditory canal, which are completely opacified. Left middle ear cavity is opacified. The left-sided ossicles are absent. The left-sided ossicles are not visualized, likely removed. No osseous erosion. There is overlying soft tissue swelling about the left external auditory canal and left ear, extending anteriorly towards the left temporal region. Mild hazy fat stranding within this region. Findings suspicious for cellulitis. There is a superimposed somewhat ill-defined hypodense collection measuring approximately 9 x 9 x 4 mm just superior to the left EAC, suspicious for possible small superimposed abscess. No other loculated fluid collection.  Visualized intracranial contents demonstrate no acute abnormality. Sigmoid sinus and jugular vein well opacified.  No adenopathy within the visualized neck.  Mild scattered opacity within the anterior ethmoidal air cells. There is mucosal thickening within the frontal sinuses/frontal ethmoidal recesses. Polypoid opacity within the maxillary sinuses bilaterally.  IMPRESSION: 1. Postoperative changes from prior canal wall down mastoidectomy. Mild overlying soft tissue swelling and inflammatory stranding about the left the EAC/ear and left temporal region suspicious for cellulitis. No intracranial extension. 2. Thin 9 x 9 x 4 mm hypodense hypodense collection at the superior margin of the left EAC, just medial to the left auricle, suspicious for small superimposed abscess.   Electronically Signed   By: Rise Mu M.D.   On: 03/30/2015 02:47    Microbiology: Recent Results (from the past 240 hour(s))  Wound culture     Status: None (Preliminary result)   Collection Time: 03/30/15  1:35 AM  Result Value Ref Range Status   Specimen Description EAR  Final    Special Requests Normal  Final   Gram Stain   Final    NO WBC SEEN FEW SQUAMOUS EPITHELIAL CELLS PRESENT ABUNDANT GRAM POSITIVE COCCI IN PAIRS IN CLUSTERS ABUNDANT GRAM POSITIVE RODS Performed at [facility]    Culture   Final    Culture reincubated for better growth Performed at [facility]    Report Status PENDING  Incomplete     Labs: Basic Metabolic Panel:  Recent Labs Lab 03/30/15 0130 03/31/15 0550  NA 138 139  K 3.6 3.8  CL 102 107  CO2 28 25  GLUCOSE 110* 109*  BUN 16 10  CREATININE 0.87 0.80  CALCIUM 8.8* 8.3*   Liver Function Tests:  Recent Labs Lab 03/30/15 0130 03/31/15 0550  AST 17 13*  ALT 18 16*  ALKPHOS 95 83  BILITOT 0.4 0.7  PROT 7.7 6.7  ALBUMIN 4.0 3.4*   No results for input(s): LIPASE, AMYLASE in the last 168 hours. No results for input(s): AMMONIA in the last 168 hours. CBC:  Recent Labs Lab 03/30/15 0130 03/31/15 0550  WBC 11.9* 9.5  NEUTROABS 6.0  --   HGB 15.4 15.1  HCT  43.6 42.6  MCV 95.0 94.2  PLT 302 302   Cardiac Enzymes: No results for input(s): CKTOTAL, CKMB, CKMBINDEX, TROPONINI in the last 168 hours. BNP: BNP (last 3 results) No results for input(s): BNP in the last 8760 hours.  ProBNP (last 3 results) No results for input(s): PROBNP in the last 8760 hours.  CBG: No results for input(s): GLUCAP in the last 168 hours.     SignedChaya Jan  [facility] Pager: [phone] 04/01/2015, 10:50 AM

## 2015-04-02 LAB — WOUND CULTURE: Gram Stain: NONE SEEN

## 2015-04-04 ENCOUNTER — Ambulatory Visit (INDEPENDENT_AMBULATORY_CARE_PROVIDER_SITE_OTHER): Payer: Self-pay | Admitting: Otolaryngology

## 2015-04-04 DIAGNOSIS — H6123 Impacted cerumen, bilateral: Secondary | ICD-10-CM

## 2015-04-04 DIAGNOSIS — H60332 Swimmer's ear, left ear: Secondary | ICD-10-CM

## 2015-04-11 ENCOUNTER — Ambulatory Visit (INDEPENDENT_AMBULATORY_CARE_PROVIDER_SITE_OTHER): Payer: Self-pay | Admitting: Otolaryngology

## 2017-10-08 ENCOUNTER — Emergency Department (HOSPITAL_COMMUNITY): Payer: Self-pay

## 2017-10-08 ENCOUNTER — Emergency Department (HOSPITAL_COMMUNITY)
Admission: EM | Admit: 2017-10-08 | Discharge: 2017-10-08 | Disposition: A | Discharge status: Home / Self Care | Payer: Self-pay | Attending: Emergency Medicine | Admitting: Emergency Medicine

## 2017-10-08 ENCOUNTER — Encounter (HOSPITAL_COMMUNITY): Payer: Self-pay

## 2017-10-08 DIAGNOSIS — Y999 Unspecified external cause status: Secondary | ICD-10-CM | POA: Insufficient documentation

## 2017-10-08 DIAGNOSIS — M545 Low back pain, unspecified: Secondary | ICD-10-CM

## 2017-10-08 DIAGNOSIS — S39012A Strain of muscle, fascia and tendon of lower back, initial encounter: Secondary | ICD-10-CM | POA: Insufficient documentation

## 2017-10-08 DIAGNOSIS — Y939 Activity, unspecified: Secondary | ICD-10-CM | POA: Insufficient documentation

## 2017-10-08 DIAGNOSIS — Y929 Unspecified place or not applicable: Secondary | ICD-10-CM | POA: Insufficient documentation

## 2017-10-08 DIAGNOSIS — F17210 Nicotine dependence, cigarettes, uncomplicated: Secondary | ICD-10-CM | POA: Insufficient documentation

## 2017-10-08 DIAGNOSIS — X509XXA Other and unspecified overexertion or strenuous movements or postures, initial encounter: Secondary | ICD-10-CM | POA: Insufficient documentation

## 2017-10-08 LAB — URINALYSIS, ROUTINE W REFLEX MICROSCOPIC
BILIRUBIN URINE: NEGATIVE
Glucose, UA: NEGATIVE mg/dL
Hgb urine dipstick: NEGATIVE
KETONES UR: NEGATIVE mg/dL
LEUKOCYTES UA: NEGATIVE
NITRITE: NEGATIVE
Protein, ur: NEGATIVE mg/dL
Specific Gravity, Urine: 1.005 (ref 1.005–1.030)
pH: 6 (ref 5.0–8.0)

## 2017-10-08 MED ORDER — IBUPROFEN 600 MG PO TABS: 600.0000 mg | ORAL_TABLET | Freq: Four times a day (QID) | ORAL | 0 refills | Status: AC | PRN

## 2017-10-08 MED ORDER — IBUPROFEN 800 MG PO TABS
800.0000 mg | ORAL_TABLET | Freq: Once | ORAL | Status: AC
  Administered 2017-10-08: 800 mg via ORAL
  Filled 2017-10-08: qty 1

## 2017-10-08 MED ORDER — CYCLOBENZAPRINE HCL 5 MG PO TABS: 5.0000 mg | ORAL_TABLET | Freq: Three times a day (TID) | ORAL | 0 refills | Status: AC | PRN

## 2017-10-08 NOTE — ED Triage Notes (Addendum)
Pt reports lower back for 2 weeks and radiating to stomach.Pain is intermittent  Denies N/V/D. No known injury

## 2017-10-08 NOTE — Discharge Instructions (Signed)
Take your next dose of ibuprofen tomorrow morning.   Avoid lifting,  Bending,  Twisting or any other activity that worsens your pain over the next week.  Apply a heating pad to your back for 20 minutes several times daily.  You should get rechecked if your symptoms are not better over the next 5 days,  Or you develop increased pain,  Weakness in your leg(s) or loss of bladder or bowel function or any new symptoms- these are symptoms of a worsening condition.

## 2017-10-09 NOTE — ED Provider Notes (Signed)
[facility] EMERGENCY DEPARTMENT Provider Note   CSN: [id number] Arrival date & time: 10/08/17  1149     History   Chief Complaint Chief Complaint  Patient presents with  . Back Pain    HPI Jason Ho is a 52 y.o. male  With no significant past medical history presenting with a 2 week history of midline lower back pain which has been intermittent and worsened with positional changes, describing it hurts to twist at the waistline and to bend to tie his shoes, but not consistently.  He denies fevers, chills, n/v, urinary or fecal incontinence or retention and no weakness or radiation of pain into his legs.  His pain wraps around to his lower abdomen bilaterally when he bends over, described as sharp and fleeting.  He is currently pain free. Works as a Public affairs consultant. He does not lift heavy objects routinely, denies falls.   The history is provided by the patient.    History reviewed. No pertinent past medical history.  Patient Active Problem List   Diagnosis Date Noted  . Elevated blood pressure reading without diagnosis of hypertension 03/31/2015  . Cellulitis, face 03/30/2015  . Cellulitis 03/30/2015  . Acute otitis externa of left ear 03/30/2015    Past Surgical History:  Procedure Laterality Date  . CERVICAL DISC SURGERY         Home Medications    Prior to Admission medications   Medication Sig Start Date End Date Taking? Authorizing Provider  ciprofloxacin (CIPRO) 500 MG tablet Take 1 tablet (500 mg total) by mouth 2 (two) times daily. Patient not taking: Reported on 10/08/2017 04/01/15   Philip Aspen, Limmie Patricia, MD  cyclobenzaprine (FLEXERIL) 5 MG tablet Take 1 tablet (5 mg total) by mouth 3 (three) times daily as needed for muscle spasms. 10/08/17   Daveena Elmore, Raynelle Fanning, PA-C  ibuprofen (ADVIL,MOTRIN) 600 MG tablet Take 1 tablet (600 mg total) by mouth every 6 (six) hours as needed. 10/08/17   Burgess Amor, PA-C    Family History No family history on file.  Social  History Social History   Tobacco Use  . Smoking status: Current Every Day Smoker    Packs/day: 0.50    Types: Cigarettes  . Smokeless tobacco: Never Used  Substance Use Topics  . Alcohol use: No  . Drug use: No     Allergies   Poison sumac extract   Review of Systems Review of Systems  Constitutional: Negative for chills and fever.  HENT: Negative.   Eyes: Negative.   Respiratory: Negative for shortness of breath.   Cardiovascular: Negative for chest pain.  Gastrointestinal: Negative for abdominal distention, abdominal pain, nausea and vomiting.  Genitourinary: Negative.   Musculoskeletal: Positive for back pain. Negative for arthralgias, joint swelling and neck pain.  Skin: Negative.  Negative for rash and wound.  Neurological: Negative for dizziness, weakness, light-headedness, numbness and headaches.  Psychiatric/Behavioral: Negative.      Physical Exam Updated Vital Signs BP (!) 150/72 (BP Location: Right Arm)   Pulse 72   Temp 98.1 F (36.7 C) (Oral)   Resp 16   Ht 5\' 4"  (1.626 m)   Wt 59 kg (130 lb)   SpO2 99%   BMI 22.31 kg/m   Physical Exam  Constitutional: He appears well-developed and well-nourished.  HENT:  Head: Normocephalic.  Eyes: Conjunctivae are normal.  Neck: Normal range of motion. Neck supple.  Cardiovascular: Normal rate and intact distal pulses.  Pedal pulses normal.  Pulmonary/Chest: Effort normal.  Abdominal:  Soft. Bowel sounds are normal. He exhibits no distension, no abdominal bruit and no pulsatile midline mass.  Musculoskeletal: Normal range of motion. He exhibits no edema.       Right shoulder: He exhibits normal range of motion, no bony tenderness, no swelling and no spasm.       Lumbar back: He exhibits tenderness. He exhibits no swelling, no edema and no spasm.  Neurological: He is alert. He has normal strength. He displays no atrophy and no tremor. No sensory deficit. Gait normal.  Reflex Scores:      Patellar reflexes are  2+ on the right side and 2+ on the left side. No strength deficit noted in hip and knee flexor and extensor muscle groups.  Ankle flexion and extension intact.  Skin: Skin is warm and dry.  Psychiatric: He has a normal mood and affect.  Nursing note and vitals reviewed.    ED Treatments / Results  Labs (all labs ordered are listed, but only abnormal results are displayed) Labs Reviewed  URINALYSIS, ROUTINE W REFLEX MICROSCOPIC - Abnormal; Notable for the following components:      Result Value   Color, Urine STRAW (*)    All other components within normal limits    EKG  EKG Interpretation None       Radiology Dg Lumbar Spine Complete  Result Date: 10/08/2017 CLINICAL DATA:  52 y/o M; lower back pain radiating to the front on both sides for 2 weeks. EXAM: LUMBAR SPINE - COMPLETE 4+ VIEW COMPARISON:  None. FINDINGS: There is no evidence of lumbar spine fracture. Alignment is normal. Mild loss of the L3-S1 intervertebral disc space height. Multilevel small anterior marginal osteophytes. Mild lower lumbar facet arthrosis. IMPRESSION: 1. No acute osseous abnormality. 2. Mild lumbar spondylosis greatest at L3 through S1 levels. Electronically Signed   By: Mitzi HansenLance  Furusawa-Stratton M.D.   On: 10/08/2017 15:31    Procedures Procedures (including critical care time)  Medications Ordered in ED Medications  ibuprofen (ADVIL,MOTRIN) tablet 800 mg (800 mg Oral Given 10/08/17 1509)     Initial Impression / Assessment and Plan / ED Course  I have reviewed the triage vital signs and the nursing notes.  Pertinent labs & imaging results that were available during my care of the patient were reviewed by me and considered in my medical decision making (see chart for details).     Pt with hx and exam suggesting mechanical low back pain. Imaging reviewed and discussed with pt including mild arthritis changes found.  He has no risk factors or exam findings suggesting abdominal source of sx.  AA  not palpable. Sx triggered by movement only. Ibuprofen, flexeril, heat. Referrals given for f/u care, or recheck here for any worsened sx which were outlined with pt.  No neuro deficit on exam or by history to suggest emergent or surgical presentation.    Final Clinical Impressions(s) / ED Diagnoses   Final diagnoses:  Strain of lumbar region, initial encounter  Acute bilateral low back pain without sciatica    ED Discharge Orders        Ordered    ibuprofen (ADVIL,MOTRIN) 600 MG tablet  Every 6 hours PRN     10/08/17 1617    cyclobenzaprine (FLEXERIL) 5 MG tablet  3 times daily PRN     10/08/17 1617       Burgess Amordol, Bhavesh Vazquez, PA-C 10/09/17 1619    Raeford RazorKohut, Stephen, MD 10/14/17 [phone]

## 2019-05-21 IMAGING — DX DG LUMBAR SPINE COMPLETE 4+V
5 series · 5 of 5 positions shown · non-contrast
Comparison: None.

CLINICAL DATA: 51 y/o M; lower back pain radiating to the front on
both sides for 2 weeks.

EXAM:
LUMBAR SPINE - COMPLETE 4+ VIEW

[l-spine ap]
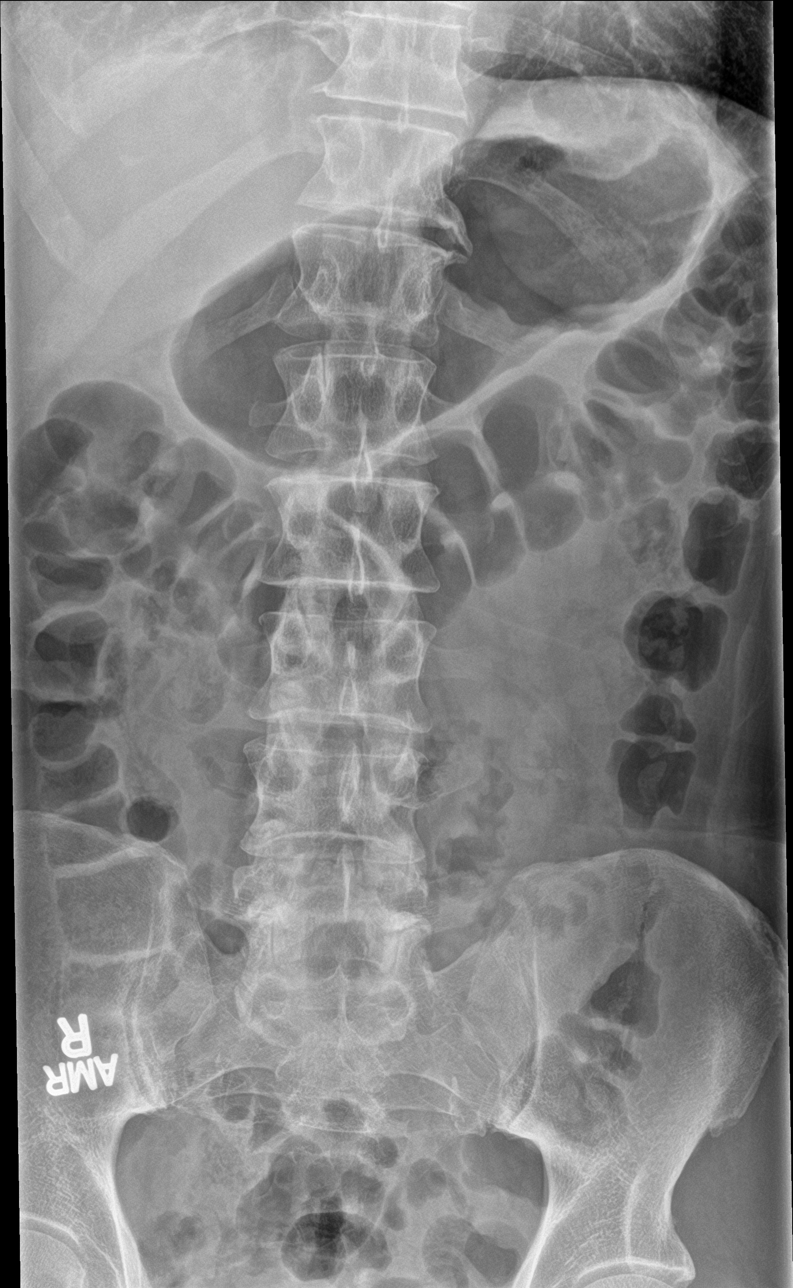

[l-spine obl (1 of 2)]
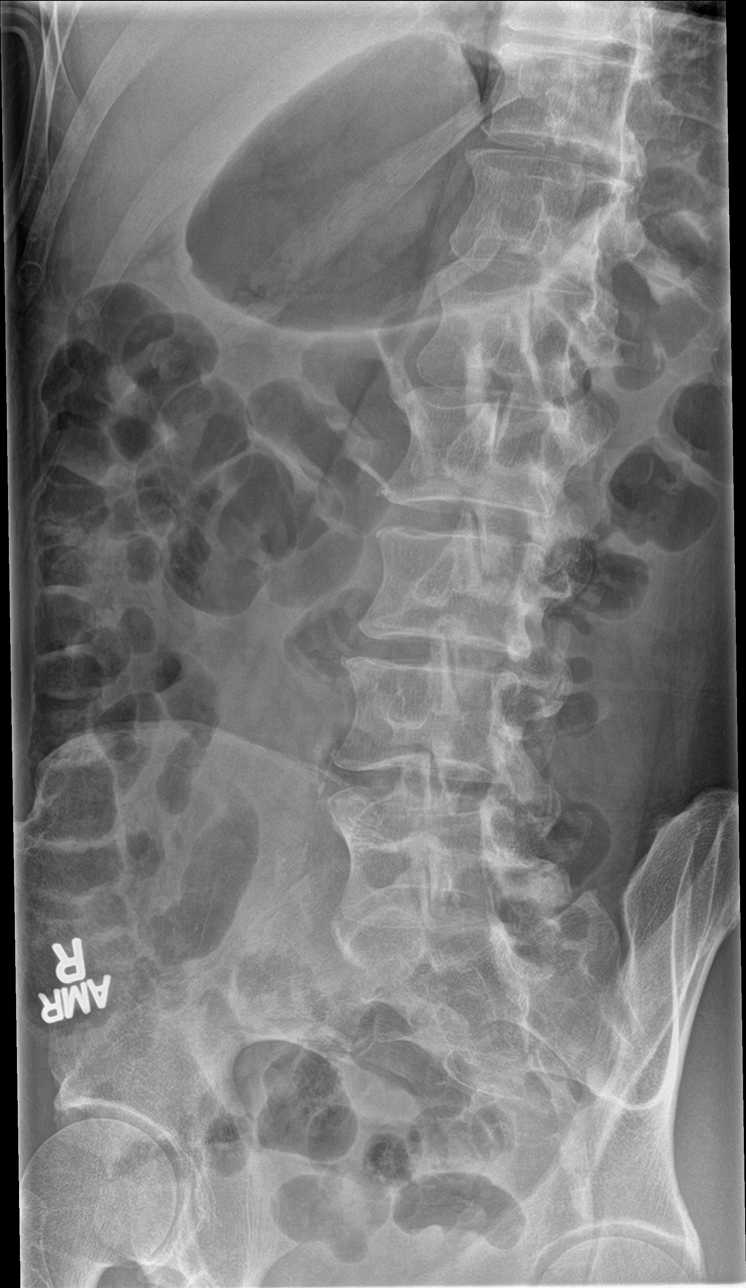

[l-spine obl (2 of 2)]
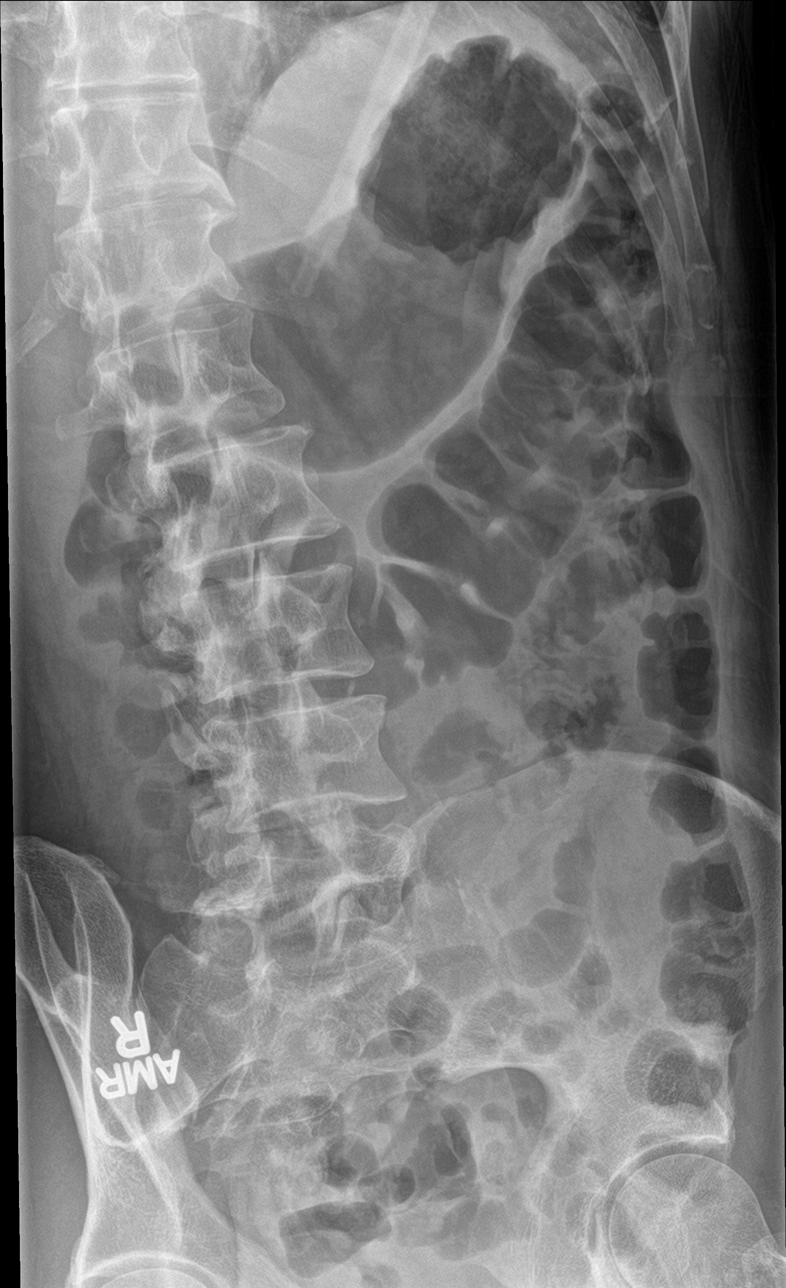

[l-spine lat]
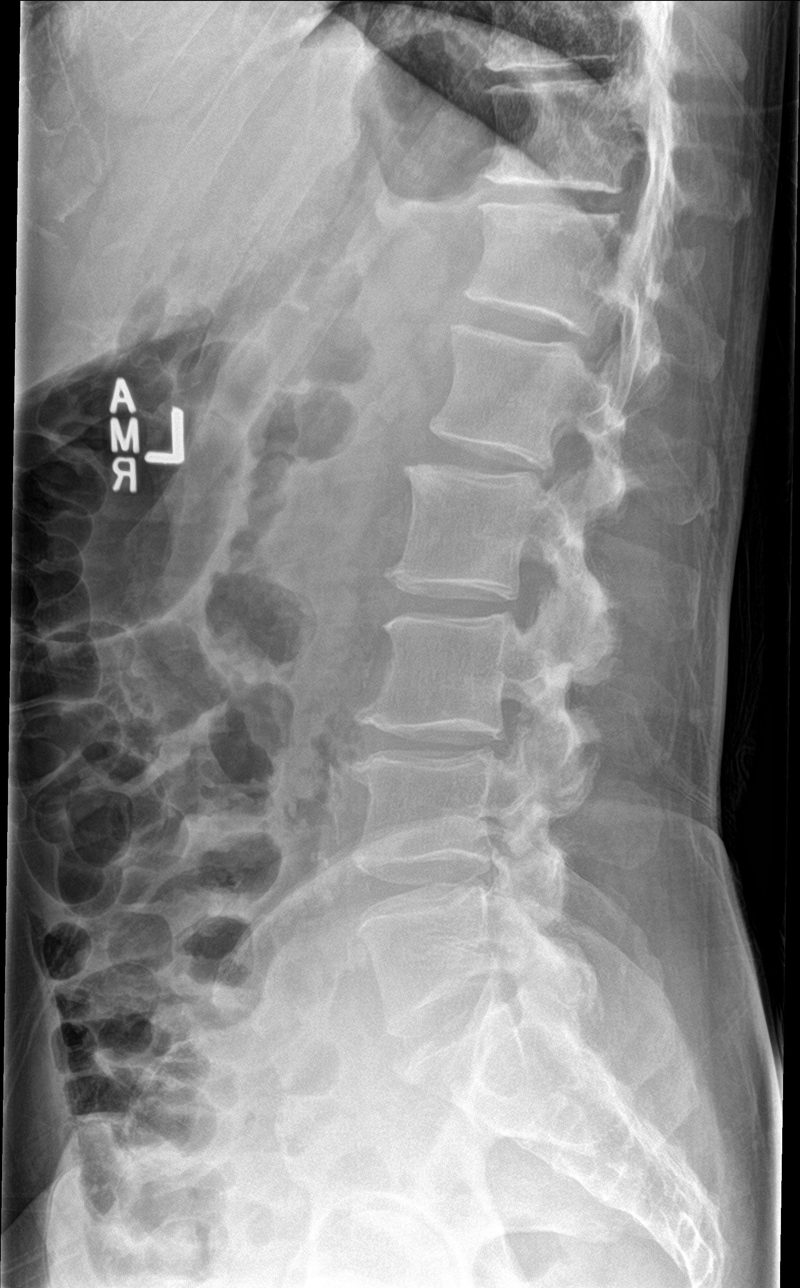

[l-spine spot]
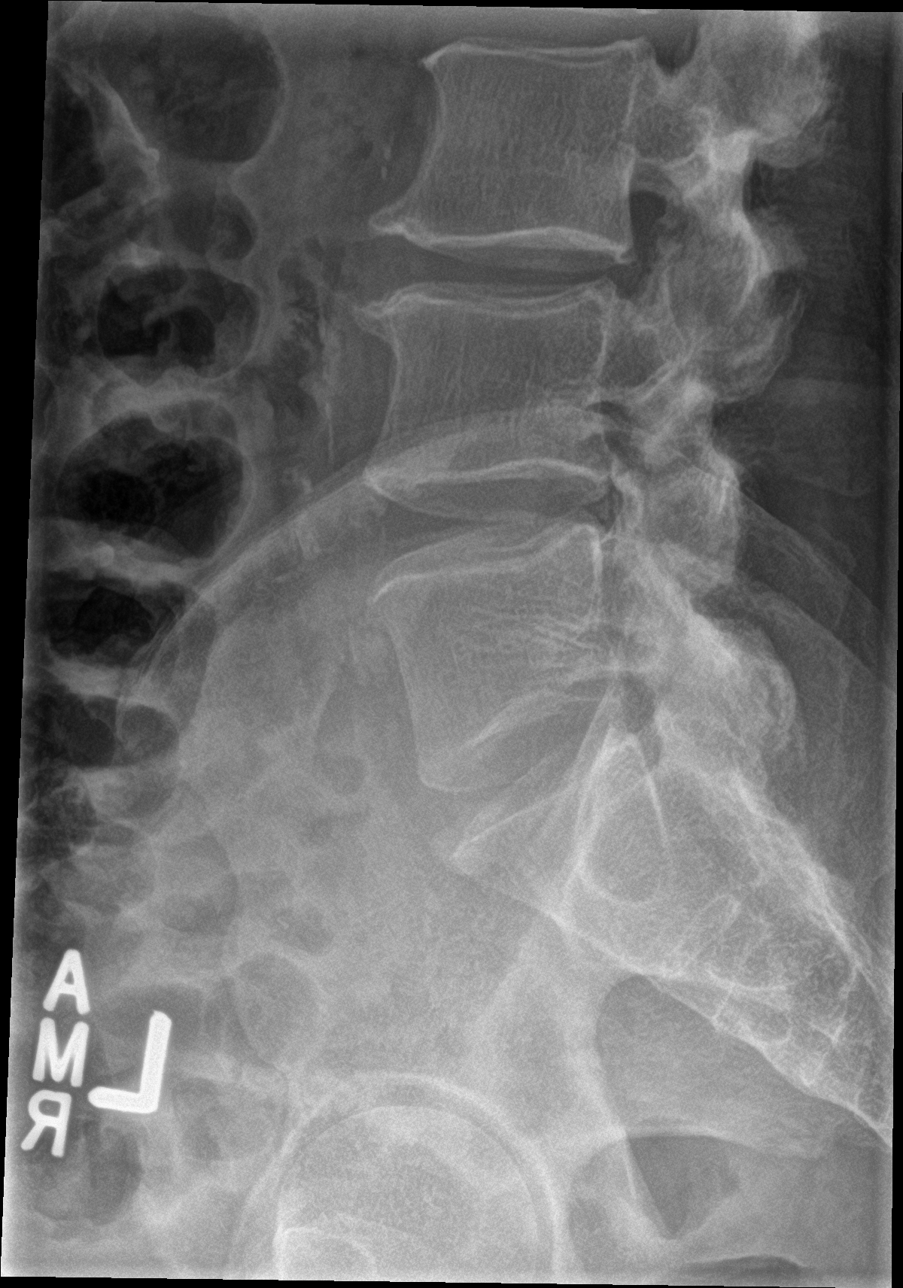

[5 of 5 positions shown; findings below may reference images not displayed]

FINDINGS: There is no evidence of lumbar spine fracture. Alignment is normal.
Mild loss of the L3-S1 intervertebral disc space height. Multilevel
small anterior marginal osteophytes. Mild lower lumbar facet
arthrosis.
IMPRESSION: 1. No acute osseous abnormality.
2. Mild lumbar spondylosis greatest at L3 through S1 levels.

By: Carline Borrelli M.D.
